# Patient Record
Sex: Male | Born: 1975 | Race: Black or African American | Hispanic: No | Marital: Single | Smoking: Never smoker
Health system: Southern US, Community
[De-identification: ages and names within clinical notes are randomized; demographics above are authoritative.]

## PROBLEM LIST (undated history)

## (undated) DIAGNOSIS — F419 Anxiety disorder, unspecified: Secondary | ICD-10-CM

## (undated) DIAGNOSIS — I2510 Atherosclerotic heart disease of native coronary artery without angina pectoris: Secondary | ICD-10-CM

## (undated) DIAGNOSIS — E785 Hyperlipidemia, unspecified: Secondary | ICD-10-CM

## (undated) HISTORY — DX: Atherosclerotic heart disease of native coronary artery without angina pectoris: I25.10

## (undated) HISTORY — PX: WISDOM TOOTH EXTRACTION: SHX21

## (undated) HISTORY — DX: Anxiety disorder, unspecified: F41.9

## (undated) HISTORY — DX: Hyperlipidemia, unspecified: E78.5

---

## 2003-03-18 ENCOUNTER — Emergency Department (HOSPITAL_COMMUNITY): Admission: EM | Admit: 2003-03-18 | Discharge: 2003-03-18 | Payer: Self-pay | Admitting: Emergency Medicine

## 2004-05-07 ENCOUNTER — Emergency Department (HOSPITAL_COMMUNITY): Admission: EM | Admit: 2004-05-07 | Discharge: 2004-05-08 | Payer: Self-pay | Admitting: Emergency Medicine

## 2018-07-18 ENCOUNTER — Emergency Department (HOSPITAL_COMMUNITY)
Admission: EM | Admit: 2018-07-18 | Discharge: 2018-07-19 | Disposition: A | Discharge status: Home / Self Care | Payer: 59 | Attending: Emergency Medicine | Admitting: Emergency Medicine

## 2018-07-18 ENCOUNTER — Other Ambulatory Visit: Payer: Self-pay

## 2018-07-18 ENCOUNTER — Encounter (HOSPITAL_COMMUNITY): Payer: Self-pay | Admitting: Emergency Medicine

## 2018-07-18 DIAGNOSIS — R0789 Other chest pain: Secondary | ICD-10-CM | POA: Diagnosis not present

## 2018-07-18 NOTE — ED Triage Notes (Signed)
Pt reports feeling something different and funny in his chest all week but pain began earlier today. Pt reports no medical hx. Pt denies, dizziness, lightheadedness. Pt also denies SHOB, chest tightness, and N/V.Pt reports tingling in his hands Pt reports having anxiety today.

## 2018-07-19 ENCOUNTER — Emergency Department (HOSPITAL_COMMUNITY): Payer: 59

## 2018-07-19 LAB — I-STAT TROPONIN, ED
Troponin i, poc: 0 ng/mL (ref 0.00–0.08)
Troponin i, poc: 0 ng/mL (ref 0.00–0.08)

## 2018-07-19 LAB — CBC
HCT: 41.5 % (ref 39.0–52.0)
Hemoglobin: 13.3 g/dL (ref 13.0–17.0)
MCH: 30.6 pg (ref 26.0–34.0)
MCHC: 32 g/dL (ref 30.0–36.0)
MCV: 95.4 fL (ref 80.0–100.0)
Platelets: 245 10*3/uL (ref 150–400)
RBC: 4.35 MIL/uL (ref 4.22–5.81)
RDW: 11.7 % (ref 11.5–15.5)
WBC: 7 10*3/uL (ref 4.0–10.5)
nRBC: 0 % (ref 0.0–0.2)

## 2018-07-19 LAB — BASIC METABOLIC PANEL
Anion gap: 9 (ref 5–15)
BUN: 12 mg/dL (ref 6–20)
CO2: 23 mmol/L (ref 22–32)
Calcium: 9.2 mg/dL (ref 8.9–10.3)
Chloride: 103 mmol/L (ref 98–111)
Creatinine, Ser: 1.12 mg/dL (ref 0.61–1.24)
GFR calc Af Amer: >60 mL/min (ref >60)
GFR calc non Af Amer: >60 mL/min (ref >60)
GLUCOSE: 107 mg/dL — AB (ref 70–99)
Potassium: 3.6 mmol/L (ref 3.5–5.1)
Sodium: 135 mmol/L (ref 135–145)

## 2018-07-19 MED ORDER — LORAZEPAM 1 MG PO TABS
1.0000 mg | ORAL_TABLET | Freq: Once | ORAL | Status: AC
  Administered 2018-07-19: 1 mg via ORAL
  Filled 2018-07-19: qty 1

## 2018-07-19 MED ORDER — HYDROXYZINE HCL 25 MG PO TABS: 25.0000 mg | ORAL_TABLET | Freq: Three times a day (TID) | ORAL | 0 refills | Status: AC | PRN

## 2018-07-19 MED ORDER — ASPIRIN 81 MG PO CHEW
324.0000 mg | CHEWABLE_TABLET | Freq: Once | ORAL | Status: AC
  Administered 2018-07-19: 324 mg via ORAL
  Filled 2018-07-19: qty 4

## 2018-07-19 NOTE — ED Provider Notes (Signed)
[facility] EMERGENCY DEPARTMENT Provider Note   CSN: [id number] Arrival date & time: 07/18/18  2330    History   Chief Complaint Chief Complaint  Patient presents with  . Chest Pain    HPI Timothy Weber is a 43 y.o. male.  HPI: A 43 year old patient presents for evaluation of chest pain. Initial onset of pain was approximately 3-6 hours ago. The patient's chest pain is described as heaviness/pressure/tightness and is not worse with exertion. The patient's chest pain is middle- or left-sided, is not well-localized, is not sharp and does not radiate to the arms/jaw/neck. The patient does not complain of nausea and denies diaphoresis. The patient has no history of stroke, has no history of peripheral artery disease, has not smoked in the past 90 days, denies any history of treated diabetes, has no relevant family history of coronary artery disease (first degree relative at less than age 44), is not hypertensive, has no history of hypercholesterolemia and does not have an elevated BMI (>=30).    Chest Pain  Associated symptoms: numbness (bilateral hands, with anxiety)   Associated symptoms: no abdominal pain, no cough, no fatigue, no fever, no headache, no nausea, no shortness of breath, no vomiting and no weakness     History reviewed. No pertinent past medical history.  There are no active problems to display for this patient.   History reviewed. No pertinent surgical history.      Home Medications    Prior to Admission medications   Medication Sig Start Date End Date Taking? Authorizing Provider  hydrOXYzine (ATARAX/VISTARIL) 25 MG tablet Take 1 tablet (25 mg total) by mouth every 8 (eight) hours as needed for anxiety. 07/19/18   Shaune Pollack, MD    Family History History reviewed. No pertinent family history.  Social History Social History   Tobacco Use  . Smoking status: Never Smoker  . Smokeless tobacco: Never Used  Substance Use Topics  .  Alcohol use: Never    Frequency: Never  . Drug use: Not Currently    Types: Marijuana     Allergies   Patient has no known allergies.   Review of Systems Review of Systems  Constitutional: Negative for chills, fatigue and fever.  HENT: Negative for congestion and rhinorrhea.   Eyes: Negative for visual disturbance.  Respiratory: Negative for cough, shortness of breath and wheezing.   Cardiovascular: Positive for chest pain. Negative for leg swelling.  Gastrointestinal: Negative for abdominal pain, diarrhea, nausea and vomiting.  Genitourinary: Negative for dysuria and flank pain.  Musculoskeletal: Negative for neck pain and neck stiffness.  Skin: Negative for rash and wound.  Allergic/Immunologic: Negative for immunocompromised state.  Neurological: Positive for numbness (bilateral hands, with anxiety). Negative for syncope, weakness and headaches.  Psychiatric/Behavioral: The patient is nervous/anxious.   All other systems reviewed and are negative.    Physical Exam Updated Vital Signs BP 112/69   Pulse (!) 57   Temp 97.9 F (36.6 C) (Oral)   Resp 15   Ht 6\' 1"  (1.854 m)   Wt 70.3 kg   SpO2 97%   BMI 20.45 kg/m   Physical Exam Vitals signs and nursing note reviewed.  Constitutional:      General: He is not in acute distress.    Appearance: He is well-developed.  HENT:     Head: Normocephalic and atraumatic.  Eyes:     Conjunctiva/sclera: Conjunctivae normal.  Neck:     Musculoskeletal: Neck supple.  Cardiovascular:  Rate and Rhythm: Normal rate and regular rhythm.     Heart sounds: Normal heart sounds. No murmur. No friction rub.  Pulmonary:     Effort: Pulmonary effort is normal. No respiratory distress.     Breath sounds: Normal breath sounds. No wheezing or rales.  Abdominal:     General: There is no distension.     Palpations: Abdomen is soft.     Tenderness: There is no abdominal tenderness.  Musculoskeletal:     Right lower leg: No edema.      Left lower leg: No edema.  Skin:    General: Skin is warm.     Capillary Refill: Capillary refill takes less than 2 seconds.  Neurological:     Mental Status: He is alert and oriented to person, place, and time.     Motor: No abnormal muscle tone.      ED Treatments / Results  Labs (all labs ordered are listed, but only abnormal results are displayed) Labs Reviewed  BASIC METABOLIC PANEL - Abnormal; Notable for the following components:      Result Value   Glucose, Bld 107 (*)    All other components within normal limits  CBC  I-STAT TROPONIN, ED  I-STAT TROPONIN, ED    EKG EKG Interpretation  Date/Time:  Wednesday July 18 2018 23:44:16 EDT Ventricular Rate:  75 PR Interval:    QRS Duration: 95 QT Interval:  404 QTC Calculation: 452 R Axis:   85 Text Interpretation:  Sinus rhythm No significant change since last tracing Confirmed by Shaune Pollack [phone]) on 07/18/2018 11:56:23 PM   Radiology Dg Chest 2 View  Result Date: 07/19/2018 CLINICAL DATA:  Left side chest pain EXAM: CHEST - 2 VIEW COMPARISON:  None. FINDINGS: Hyperinflation. Heart and mediastinal contours are within normal limits. Biapical scarring. No focal opacities or effusions. No acute bony abnormality. IMPRESSION: Hyperinflation.  No active cardiopulmonary disease. Electronically Signed   By: Charlett Nose M.D.   On: 07/19/2018 00:34    Procedures Procedures (including critical care time)  Medications Ordered in ED Medications  aspirin chewable tablet 324 mg (324 mg Oral Given 07/19/18 0047)  LORazepam (ATIVAN) tablet 1 mg (1 mg Oral Given 07/19/18 0240)     Initial Impression / Assessment and Plan / ED Course  I have reviewed the triage vital signs and the nursing notes.  Pertinent labs & imaging results that were available during my care of the patient were reviewed by me and considered in my medical decision making (see chart for details).  Clinical Course as of Jul 19 743  Thu Jul 19, 2018   0020 43 yo M here with atypical CP. Suspect possible anxiety, DDx includes gastritis, transient likely benign arrhythmia. Low concern for ACS and HEART score is <3. Pain is not c/w PE and he is PERC neg. Pain not c/w dissection. Will check labs, CXR, re-assess. Reassurance provided.   [CI]    Clinical Course User Index [CI] Shaune Pollack, MD    HEAR Score: 1  Delta trop negative. Doubt ACS. Will d/c with outpt follow-up, return precautions. No apaprent emergent pathology. VSS in ED. He is pain free here.   Final Clinical Impressions(s) / ED Diagnoses   Final diagnoses:  Atypical chest pain    ED Discharge Orders         Ordered    hydrOXYzine (ATARAX/VISTARIL) 25 MG tablet  Every 8 hours PRN     07/19/18 0308  Shaune Pollack, MD 07/19/18 [phone]

## 2019-11-15 ENCOUNTER — Ambulatory Visit: Payer: No Typology Code available for payment source | Admitting: Cardiology

## 2019-11-15 ENCOUNTER — Encounter: Payer: Self-pay | Admitting: Cardiology

## 2019-11-15 ENCOUNTER — Other Ambulatory Visit: Payer: Self-pay

## 2019-11-15 VITALS — BP 129/76 | HR 71 | Ht 73.0 in | Wt 158.0 lb

## 2019-11-15 DIAGNOSIS — R079 Chest pain, unspecified: Secondary | ICD-10-CM

## 2019-11-15 DIAGNOSIS — E782 Mixed hyperlipidemia: Secondary | ICD-10-CM

## 2019-11-15 MED ORDER — METOPROLOL TARTRATE 25 MG PO TABS: 25.0000 mg | ORAL_TABLET | Freq: Two times a day (BID) | ORAL | 0 refills | Status: DC

## 2019-11-15 NOTE — Progress Notes (Signed)
Date:  11/15/2019   ID:  Timothy Weber, DOB 15-Dec-1975, MRN [id number]  PCP:  Timothy Rosser, PA-C  Cardiologist:  Timothy Kras, DO, [facility] (established care 11/15/2019)  REASON FOR CONSULT: Chest pain, atypical  REQUESTING PHYSICIAN:  Timothy Rosser, PA-C [address] [place],  [place] [address]  Chief Complaint  Patient presents with  . Chest Pain    pt c/o chest pain for 2 months  . New Patient (Initial Visit)    HPI  Timothy Weber is a 44 y.o. male who presents to the office with a chief complaint of " chest pain." He is referred to the office at the request of Long, [place], PA-C. Patient's past medical history and cardiovascular risk factors include: Mixed hyperlipidemia.  Patient is referred to the office with a request of his primary care provider for evaluation of chest pain.  Patient stated that his chest pain has been going on for approximately 2 months.  He describes it as a tightness-like sensation over his left anterior chest wall, intermittent duration and more noticeable nowadays.  The intensity is 2 out of 10 constant throughout the day, nonradiating, better with exercise, no worsening factors.  The pain is usually self-limited.  No prior history of chest trauma, no history of pneumonia or COVID-19 infection. The pain is not positional or pleuritic.  No premature CAD in family or sudden cardiac death.   Denies prior history of coronary artery disease, myocardial infarction, congestive heart failure, deep venous thrombosis, pulmonary embolism, stroke, transient ischemic attack.  FUNCTIONAL STATUS: Running in place, push up and pull pull atleast three days a week.    ALLERGIES: No Known Allergies  MEDICATION LIST PRIOR TO VISIT: Current Meds  Medication Sig  . hydrOXYzine (ATARAX/VISTARIL) 25 MG tablet Take 1 tablet (25 mg total) by mouth every 8 (eight) hours as needed for anxiety.  Marland Kitchen ibuprofen (ADVIL) 200 MG tablet Take 200 mg by mouth every 6 (six) hours as  needed.  . Multiple Vitamin (MULTIVITAMIN) LIQD Take 5 mLs by mouth daily.  . rosuvastatin (CRESTOR) 10 MG tablet Take 10 mg by mouth daily.     PAST MEDICAL HISTORY: Past Medical History:  Diagnosis Date  . Anxiety   . Hyperlipidemia     PAST SURGICAL HISTORY: Past Surgical History:  Procedure Laterality Date  . WISDOM TOOTH EXTRACTION      FAMILY HISTORY: The patient family history includes Hypertension in his mother; Lung cancer in his father; Thyroid disease in his sister.  SOCIAL HISTORY:  The patient  reports that he has never smoked. He has never used smokeless tobacco. He reports current alcohol use. He reports previous drug use. Drug: Marijuana.  REVIEW OF SYSTEMS: Review of Systems  Constitutional: Negative for chills and fever.  HENT: Negative for hoarse voice and nosebleeds.   Eyes: Negative for discharge, double vision and pain.  Cardiovascular: Positive for chest pain. Negative for claudication, dyspnea on exertion, leg swelling, near-syncope, orthopnea, palpitations, paroxysmal nocturnal dyspnea and syncope.  Respiratory: Negative for hemoptysis and shortness of breath.   Musculoskeletal: Negative for muscle cramps and myalgias.  Gastrointestinal: Negative for abdominal pain, constipation, diarrhea, hematemesis, hematochezia, melena, nausea and vomiting.  Neurological: Negative for dizziness and light-headedness.    PHYSICAL EXAM: Vitals with BMI 11/15/2019 07/19/2018 07/19/2018  Height '6\' 1"'  - -  Weight 158 lbs - -  BMI 93.73 - -  Systolic 428 768 115  Diastolic 76 69 69  Pulse 71 57 54    CONSTITUTIONAL: Well-developed  and well-nourished. No acute distress.  SKIN: Skin is warm and dry. No rash noted. No cyanosis. No pallor. No jaundice HEAD: Normocephalic and atraumatic.  EYES: No scleral icterus MOUTH/THROAT: Moist oral membranes.  NECK: No JVD present. No thyromegaly noted. No carotid bruits  LYMPHATIC: No visible cervical adenopathy.  CHEST Normal  respiratory effort. No intercostal retractions  LUNGS: Clear to auscultation bilaterally. No stridor. No wheezes. No rales.  CARDIOVASCULAR: Regular rate and rhythm, positive S1-S2, no murmurs rubs or gallops appreciated. ABDOMINAL: No apparent ascites.  EXTREMITIES: No peripheral edema  HEMATOLOGIC: No significant bruising NEUROLOGIC: Oriented to person, place, and time. Nonfocal. Normal muscle tone.  PSYCHIATRIC: Normal mood and affect. Normal behavior. Cooperative  CARDIAC DATABASE: EKG: 11/15/2019: Normal sinus rhythm, 62 bpm, normal axis poor R wave progression without underlying ischemia or injury.  Echocardiogram: None  Stress Testing: None  Heart Catheterization: None  LABORATORY DATA: External Labs: Collected: 10/17/2019 Creatinine 1.24 mg/dL. eGFR: 82 mL/min per 1.73 m Lipid profile: Total cholesterol 230, triglycerides 62, HDL 59, LDL 156, non-HDL 171 TSH: 1.27   IMPRESSION:    ICD-10-CM   1. Chest pain, unspecified type  R07.9 EKG 12-Lead    PCV ECHOCARDIOGRAM COMPLETE    CT CORONARY MORPH W/CTA COR W/SCORE W/CA W/CM &/OR WO/CM    CT CORONARY FRACTIONAL FLOW RESERVE DATA PREP    CT CORONARY FRACTIONAL FLOW RESERVE FLUID ANALYSIS    Basic metabolic panel    metoprolol tartrate (LOPRESSOR) 25 MG tablet  2. Mixed hyperlipidemia  E78.2      RECOMMENDATIONS: Timothy Weber is a 44 y.o. male whose past medical history and cardiac risk factors include: Mixed hyperlipidemia.  Precordial chest pain:  EKG showed normal sinus rhythm without underlying ischemia or injury pattern.  Echocardiogram will be ordered to evaluate for structural heart disease and left ventricular systolic function.  Shared decision was to proceed with coronary CTA w/ FFR.  Check BMP prior to coronary CTA.  Metoprolol 25 mg p.o. twice daily starting 2 days prior to the CT scan.  Mixed hyperlipidemia:   Patient was recently started on Crestor 10 mg p.o. nightly by his primary care  provider after the labs from June 2021.    Currently managed by PCP.    Does not endorse myalgias.  FINAL MEDICATION LIST END OF ENCOUNTER: Meds ordered this encounter  Medications  . metoprolol tartrate (LOPRESSOR) 25 MG tablet    Sig: Take 1 tablet (25 mg total) by mouth 2 (two) times daily for 4 days.    Dispense:  8 tablet    Refill:  0    There are no discontinued medications.   Current Outpatient Medications:  .  hydrOXYzine (ATARAX/VISTARIL) 25 MG tablet, Take 1 tablet (25 mg total) by mouth every 8 (eight) hours as needed for anxiety., Disp: 12 tablet, Rfl: 0 .  ibuprofen (ADVIL) 200 MG tablet, Take 200 mg by mouth every 6 (six) hours as needed., Disp: , Rfl:  .  Multiple Vitamin (MULTIVITAMIN) LIQD, Take 5 mLs by mouth daily., Disp: , Rfl:  .  rosuvastatin (CRESTOR) 10 MG tablet, Take 10 mg by mouth daily., Disp: , Rfl:  .  metoprolol tartrate (LOPRESSOR) 25 MG tablet, Take 1 tablet (25 mg total) by mouth 2 (two) times daily for 4 days., Disp: 8 tablet, Rfl: 0  Orders Placed This Encounter  Procedures  . CT CORONARY MORPH W/CTA COR W/SCORE W/CA W/CM &/OR WO/CM  . CT CORONARY FRACTIONAL FLOW RESERVE DATA PREP  . CT  CORONARY FRACTIONAL FLOW RESERVE FLUID ANALYSIS  . Basic metabolic panel  . EKG 12-Lead  . PCV ECHOCARDIOGRAM COMPLETE    Patient Instructions  Increase fluid intake to 8 glass of water.  Decrease motrin use, talk to PCP.  Blood work couple days before CT Scan.  Start Lopressor two days before the CT Scan.    --Continue cardiac medications as reconciled in final medication list. --Return in about 4 weeks (around 12/13/2019) for review test results CCTA and Echo. . Or sooner if needed. --Continue follow-up with your primary care physician regarding the management of your other chronic comorbid conditions.  Patient's questions and concerns were addressed to his satisfaction. He voices understanding of the instructions provided during this encounter.   This  note was created using a voice recognition software as a result there may be grammatical errors inadvertently enclosed that do not reflect the nature of this encounter. Every attempt is made to correct such errors.  Timothy Weber, [place], [facility]  Pager: [phone] Office: [phone]

## 2019-11-15 NOTE — Patient Instructions (Addendum)
Increase fluid intake to 8 glass of water.  Decrease motrin use, talk to PCP.  Blood work couple days before CT Scan.  Start Lopressor two days before the CT Scan.

## 2019-11-16 ENCOUNTER — Other Ambulatory Visit: Payer: Self-pay | Admitting: Cardiology

## 2019-11-16 DIAGNOSIS — R079 Chest pain, unspecified: Secondary | ICD-10-CM

## 2019-11-17 ENCOUNTER — Other Ambulatory Visit: Payer: Self-pay | Admitting: Cardiology

## 2019-11-17 DIAGNOSIS — R079 Chest pain, unspecified: Secondary | ICD-10-CM

## 2019-11-18 ENCOUNTER — Other Ambulatory Visit: Payer: Self-pay | Admitting: Cardiology

## 2019-11-18 DIAGNOSIS — R079 Chest pain, unspecified: Secondary | ICD-10-CM

## 2019-11-19 ENCOUNTER — Other Ambulatory Visit: Payer: Self-pay | Admitting: Cardiology

## 2019-11-19 DIAGNOSIS — R079 Chest pain, unspecified: Secondary | ICD-10-CM

## 2019-11-21 ENCOUNTER — Other Ambulatory Visit: Payer: Self-pay

## 2019-11-21 DIAGNOSIS — R079 Chest pain, unspecified: Secondary | ICD-10-CM

## 2019-11-21 MED ORDER — METOPROLOL TARTRATE 25 MG PO TABS: ORAL_TABLET | ORAL | 0 refills | Status: DC

## 2019-11-22 ENCOUNTER — Other Ambulatory Visit: Payer: Self-pay

## 2019-11-22 ENCOUNTER — Ambulatory Visit: Payer: No Typology Code available for payment source

## 2019-11-22 DIAGNOSIS — R079 Chest pain, unspecified: Secondary | ICD-10-CM

## 2019-11-28 ENCOUNTER — Telehealth: Payer: Self-pay

## 2019-11-28 NOTE — Telephone Encounter (Signed)
-----   Message from [place], [place] sent at 11/24/2019  4:28 PM EDT ----- The left ventricular ejection fraction or pumping activity of the heart is within the normal limit.  Other details will be reviewed at the next office visit.

## 2019-11-28 NOTE — Telephone Encounter (Signed)
LMTCB with results.

## 2019-12-02 ENCOUNTER — Other Ambulatory Visit: Payer: Self-pay | Admitting: Cardiology

## 2019-12-03 LAB — BASIC METABOLIC PANEL
BUN/Creatinine Ratio: 9 (ref 9–20)
BUN: 11 mg/dL (ref 6–24)
CO2: 25 mmol/L (ref 20–29)
Calcium: 9.2 mg/dL (ref 8.7–10.2)
Chloride: 101 mmol/L (ref 96–106)
Creatinine, Ser: 1.19 mg/dL (ref 0.76–1.27)
GFR calc Af Amer: 86 mL/min/{1.73_m2} (ref >59)
GFR calc non Af Amer: 74 mL/min/{1.73_m2} (ref >59)
Glucose: 160 mg/dL — ABNORMAL HIGH (ref 65–99)
Potassium: 4.2 mmol/L (ref 3.5–5.2)
Sodium: 139 mmol/L (ref 134–144)

## 2019-12-04 NOTE — Progress Notes (Signed)
Called patient, NA, LMAM to call back for Lab results.

## 2019-12-04 NOTE — Progress Notes (Signed)
Patient called back, I have discussed lab results with patient.

## 2019-12-09 ENCOUNTER — Telehealth (HOSPITAL_COMMUNITY): Payer: Self-pay | Admitting: *Deleted

## 2019-12-09 NOTE — Telephone Encounter (Signed)
Reaching out to patient to offer assistance regarding upcoming cardiac imaging study; pt verbalizes understanding of appt date/time, parking situation and where to check in, pre-test NPO status and medications ordered, and verified current allergies; name and call back number provided for further questions should they arise ° °Damondre Pfeifle Tai RN Navigator Cardiac Imaging °[facility] °[phone] office °[phone] cell ° °

## 2019-12-11 ENCOUNTER — Ambulatory Visit (HOSPITAL_COMMUNITY)
Admission: RE | Admit: 2019-12-11 | Discharge: 2019-12-11 | Disposition: A | Discharge status: Home / Self Care | Payer: No Typology Code available for payment source | Source: Ambulatory Visit | Attending: Cardiology | Admitting: Cardiology

## 2019-12-11 ENCOUNTER — Other Ambulatory Visit: Payer: Self-pay

## 2019-12-11 ENCOUNTER — Encounter: Payer: No Typology Code available for payment source | Admitting: *Deleted

## 2019-12-11 DIAGNOSIS — R079 Chest pain, unspecified: Secondary | ICD-10-CM | POA: Insufficient documentation

## 2019-12-11 DIAGNOSIS — Z006 Encounter for examination for normal comparison and control in clinical research program: Secondary | ICD-10-CM

## 2019-12-11 MED ORDER — NITROGLYCERIN 0.4 MG SL SUBL
0.8000 mg | SUBLINGUAL_TABLET | Freq: Once | SUBLINGUAL | Status: AC
  Administered 2019-12-11: 0.8 mg via SUBLINGUAL

## 2019-12-11 MED ORDER — NITROGLYCERIN 0.4 MG SL SUBL
SUBLINGUAL_TABLET | SUBLINGUAL | Status: AC
  Filled 2019-12-11: qty 2

## 2019-12-11 MED ORDER — IOHEXOL 350 MG/ML SOLN
80.0000 mL | Freq: Once | INTRAVENOUS | Status: AC | PRN
  Administered 2019-12-11: 80 mL via INTRAVENOUS

## 2019-12-11 NOTE — Research (Signed)
CADFEM Informed Consent                  Subject Name:   Timothy Weber   Subject met inclusion and exclusion criteria.  The informed consent form, study requirements and expectations were reviewed with the subject and questions and concerns were addressed prior to the signing of the consent form.  The subject verbalized understanding of the trial requirements.  The subject agreed to participate in the CADFEM trial and signed the informed consent.  The informed consent was obtained prior to performance of any protocol-specific procedures for the subject.  A copy of the signed informed consent was given to the subject and a copy was placed in the subject's medical record.   [place] Charese Abundis, Research Assistant  12/11/2019 12:11 p.m.

## 2019-12-13 ENCOUNTER — Other Ambulatory Visit: Payer: Self-pay | Admitting: Cardiology

## 2019-12-13 ENCOUNTER — Ambulatory Visit: Payer: No Typology Code available for payment source | Admitting: Cardiology

## 2019-12-13 ENCOUNTER — Ambulatory Visit (HOSPITAL_COMMUNITY)
Admission: RE | Admit: 2019-12-13 | Discharge: 2019-12-13 | Disposition: A | Discharge status: Home / Self Care | Payer: No Typology Code available for payment source | Source: Ambulatory Visit | Attending: Cardiology | Admitting: Cardiology

## 2019-12-13 DIAGNOSIS — R079 Chest pain, unspecified: Secondary | ICD-10-CM | POA: Diagnosis not present

## 2019-12-16 ENCOUNTER — Encounter: Payer: Self-pay | Admitting: Cardiology

## 2019-12-16 ENCOUNTER — Other Ambulatory Visit: Payer: Self-pay

## 2019-12-16 ENCOUNTER — Ambulatory Visit: Payer: No Typology Code available for payment source | Admitting: Cardiology

## 2019-12-16 VITALS — BP 130/65 | HR 73 | Resp 17 | Ht 73.0 in | Wt 158.0 lb

## 2019-12-16 DIAGNOSIS — E782 Mixed hyperlipidemia: Secondary | ICD-10-CM

## 2019-12-16 DIAGNOSIS — Z712 Person consulting for explanation of examination or test findings: Secondary | ICD-10-CM

## 2019-12-16 DIAGNOSIS — R072 Precordial pain: Secondary | ICD-10-CM

## 2019-12-16 DIAGNOSIS — I2510 Atherosclerotic heart disease of native coronary artery without angina pectoris: Secondary | ICD-10-CM

## 2019-12-16 MED ORDER — ASPIRIN EC 81 MG PO TBEC: 81.0000 mg | DELAYED_RELEASE_TABLET | Freq: Every day | ORAL | 11 refills | Status: AC

## 2019-12-16 MED ORDER — METOPROLOL SUCCINATE ER 25 MG PO TB24: 25.0000 mg | ORAL_TABLET | Freq: Every day | ORAL | 0 refills | Status: AC

## 2019-12-16 NOTE — Progress Notes (Signed)
Date:  12/22/2019   ID:  [name], DOB July 14, 1975, MRN [id number]  PCP:  Shanon Rosser, PA-C  Cardiologist:  Rex Kras, DO, [facility] (established care 11/15/2019)  Date: 12/16/2019 Last Office Visit: 11/15/2019  Chief Complaint  Patient presents with  . Chest Pain  . Follow-up    4 week  . Results    echo    HPI  Timothy Weber is a 44 y.o. male who presents to the office with a chief complaint of " reevaluation of chest pain to review test results." Patient's past medical history and cardiovascular risk factors include: Nonobstructive CAD, mixed hyperlipidemia.  Patient is referred to the office with a request of his primary care provider for evaluation of chest pain.  At last office visit patient was having symptoms of chest discomfort which appeared to be atypical in nature.  However, due to cardiovascular risk factors this shared decision was to proceed with coronary CTA and echocardiogram.  Since last office visit patient states that he continues to have chest discomfort over his left anterior chest wall, he describes it as a tightness-like sensation, notes present throughout the day.  The pain is not pleuritic in nature and nonpositional.  It is not worse with activity nor is improved with rest and is usually self-limited.  Patient is informed that his echocardiogram notes preserved LVEF with normal diastolic function.  Coronary CTA notes a total coronary artery calcification score 0 but he does have noncalcified plaque noted in the coronary tree.  Findings discussed with the patient along with the images of the coronary CT FFR.   No premature CAD in family or sudden cardiac death.   Denies prior history of coronary artery disease, myocardial infarction, congestive heart failure, deep venous thrombosis, pulmonary embolism, stroke, transient ischemic attack.  FUNCTIONAL STATUS: Running in place, push up and pull up atleast three days a week.    ALLERGIES: No Known  Allergies  MEDICATION LIST PRIOR TO VISIT: Current Meds  Medication Sig  . hydrOXYzine (ATARAX/VISTARIL) 25 MG tablet Take 1 tablet (25 mg total) by mouth every 8 (eight) hours as needed for anxiety.  Marland Kitchen ibuprofen (ADVIL) 200 MG tablet Take 200 mg by mouth every 6 (six) hours as needed.  . Multiple Vitamin (MULTIVITAMIN) LIQD Take 5 mLs by mouth daily.  . rosuvastatin (CRESTOR) 10 MG tablet Take 10 mg by mouth daily.     PAST MEDICAL HISTORY: Past Medical History:  Diagnosis Date  . Anxiety   . Coronary artery disease   . Hyperlipidemia     PAST SURGICAL HISTORY: Past Surgical History:  Procedure Laterality Date  . WISDOM TOOTH EXTRACTION      FAMILY HISTORY: The patient family history includes Hypertension in his mother; Lung cancer in his father; Thyroid disease in his sister.  SOCIAL HISTORY:  The patient  reports that he has never smoked. He has never used smokeless tobacco. He reports current alcohol use. He reports previous drug use. Drug: Marijuana.  REVIEW OF SYSTEMS: Review of Systems  Constitutional: Negative for chills and fever.  HENT: Negative for hoarse voice and nosebleeds.   Eyes: Negative for discharge, double vision and pain.  Cardiovascular: Positive for chest pain. Negative for claudication, dyspnea on exertion, leg swelling, near-syncope, orthopnea, palpitations, paroxysmal nocturnal dyspnea and syncope.  Respiratory: Negative for hemoptysis and shortness of breath.   Musculoskeletal: Negative for muscle cramps and myalgias.  Gastrointestinal: Negative for abdominal pain, constipation, diarrhea, hematemesis, hematochezia, melena, nausea and vomiting.  Neurological: Negative for  dizziness and light-headedness.    PHYSICAL EXAM: Vitals with BMI 12/16/2019 12/11/2019 12/11/2019  Height '6\' 1"'  - -  Weight 158 lbs - -  BMI 21.30 - -  Systolic 865 784 96  Diastolic 65 71 72  Pulse 73 - -    CONSTITUTIONAL: Well-developed and well-nourished. No acute  distress.  SKIN: Skin is warm and dry. No rash noted. No cyanosis. No pallor. No jaundice HEAD: Normocephalic and atraumatic.  EYES: No scleral icterus MOUTH/THROAT: Moist oral membranes.  NECK: No JVD present. No thyromegaly noted. No carotid bruits  LYMPHATIC: No visible cervical adenopathy.  CHEST Normal respiratory effort. No intercostal retractions  LUNGS: Clear to auscultation bilaterally. No stridor. No wheezes. No rales.  CARDIOVASCULAR: Regular rate and rhythm, positive S1-S2, no murmurs rubs or gallops appreciated. ABDOMINAL: No apparent ascites.  EXTREMITIES: No peripheral edema  HEMATOLOGIC: No significant bruising NEUROLOGIC: Oriented to person, place, and time. Nonfocal. Normal muscle tone.  PSYCHIATRIC: Normal mood and affect. Normal behavior. Cooperative  CARDIAC DATABASE: EKG: 11/15/2019: Normal sinus rhythm, 62 bpm, normal axis poor R wave progression without underlying ischemia or injury.  Echocardiogram: 11/22/2019:  Left ventricle cavity is normal in size and wall thickness. Normal global wall motion. Normal LV systolic function with EF 55%. Normal diastolic filling pattern. Calculated EF 55%.  Mild prolapse of the mitral valve leaflets. Trace mitral regurgitation.  Mild tricuspid regurgitation. Estimated pulmonary artery systolic pressure 28 mmHg.   Stress Testing: None  Heart Catheterization: None  Coronary CTA 12/11/2019: 1. Coronary calcium score of 0. 2. Normal coronary origin with right dominance. 3. CADRADS = 3. Minimal noncalcified plaque in the left main. Mild stenosis in the mid to distal LAD. Mild to moderate stenosis in the LCX distribution. 4.  Mid ascending aorta measures 28 mm. 5. Study is sent for CT-FFR findings will be performed and reported separately. 6. Hyperdense lesions in the liver are difficult to characterize due to size. In the absence of known malignancy, lesions may represent flash fill hemangiomas. If further evaluation is  desired, MR abdomen without and with contrast could be performed. 7. Emphysema  RECOMMENDATIONS: CAD-RADS 3: Moderate stenosis. Consider symptom-guided anti-ischemic pharmacotherapy as well as risk factor modification per guideline directed care. No significant disease stenosis due to CT FFR.  CTFFR 12/13/2019: CT FFR analysis showed no significant stenosis.  LABORATORY DATA: External Labs: Collected: 10/17/2019 Creatinine 1.24 mg/dL. eGFR: 82 mL/min per 1.73 m Lipid profile: Total cholesterol 230, triglycerides 62, HDL 59, LDL 156, non-HDL 171 TSH: 1.27   IMPRESSION:    ICD-10-CM   1. Nonobstructive atherosclerosis of coronary artery  I25.10 aspirin EC 81 MG tablet    metoprolol succinate (TOPROL XL) 25 MG 24 hr tablet  2. Mixed hyperlipidemia  E78.2   3. Precordial chest pain  R07.2   4. Encounter to discuss test results  Z71.2      RECOMMENDATIONS: Timothy Weber is a 44 y.o. male whose past medical history and cardiac risk factors include: Nonobstructive CAD, mixed hyperlipidemia.  Non-obstructive coronary artery disease:   CCTA notes non-obstructive CAD as noted above.   Educated on risk factor modifications.   Continue statin   Start Aspirin 80m po qday.   Start Toprol 265mpo qday. Medication profile discussed with the patient.   Reviewed results of echo and CCTA with patient at today's office visit.   Precordial chest pain:  Reviewed the results of the echocardiogram and coronary CTA extensively with the patient at today's office visit.   Patient  symptoms of chest discomfort appear to be atypical in nature.  Recommend non-cardiac causes of chest discomfort with patient's PCP.  Mixed hyperlipidemia:   Currently on Crestor 10 mg p.o. nightly.  Currently managed by PCP.    Does not endorse myalgias.  Patient's coronary CTA report also noted noncardiac findings of "Hyperdense lesions in the liver are difficult to characterize due to size. In the absence  of known malignancy, lesions may represent flash fill hemangiomas. If further evaluation is desired, MR abdomen without and with contrast could be performed." Patient is asked to follow up with PCP for additional workup if clinically indicated.  Patient verbalizes understanding and provides verbal feedback.  FINAL MEDICATION LIST END OF ENCOUNTER: Meds ordered this encounter  Medications  . aspirin EC 81 MG tablet    Sig: Take 1 tablet (81 mg total) by mouth daily. Swallow whole.    Dispense:  30 tablet    Refill:  11  . metoprolol succinate (TOPROL XL) 25 MG 24 hr tablet    Sig: Take 1 tablet (25 mg total) by mouth daily.    Dispense:  30 tablet    Refill:  0     Current Outpatient Medications:  .  hydrOXYzine (ATARAX/VISTARIL) 25 MG tablet, Take 1 tablet (25 mg total) by mouth every 8 (eight) hours as needed for anxiety., Disp: 12 tablet, Rfl: 0 .  ibuprofen (ADVIL) 200 MG tablet, Take 200 mg by mouth every 6 (six) hours as needed., Disp: , Rfl:  .  Multiple Vitamin (MULTIVITAMIN) LIQD, Take 5 mLs by mouth daily., Disp: , Rfl:  .  rosuvastatin (CRESTOR) 10 MG tablet, Take 10 mg by mouth daily., Disp: , Rfl:  .  aspirin EC 81 MG tablet, Take 1 tablet (81 mg total) by mouth daily. Swallow whole., Disp: 30 tablet, Rfl: 11 .  metoprolol succinate (TOPROL XL) 25 MG 24 hr tablet, Take 1 tablet (25 mg total) by mouth daily., Disp: 30 tablet, Rfl: 0  No orders of the defined types were placed in this encounter.  There are no Patient Instructions on file for this visit.  --Continue cardiac medications as reconciled in final medication list. --Return in about 3 months (around 03/17/2020) for re-evaluation of symptoms.. Or sooner if needed. --Continue follow-up with your primary care physician regarding the management of your other chronic comorbid conditions.  Patient's questions and concerns were addressed to his satisfaction. He voices understanding of the instructions provided during this  encounter.   This note was created using a voice recognition software as a result there may be grammatical errors inadvertently enclosed that do not reflect the nature of this encounter. Every attempt is made to correct such errors.  Rex Kras, [place], [facility]  Pager: [phone] Office: [phone]

## 2019-12-17 NOTE — Progress Notes (Signed)
Left voicemail for patient to call back. 

## 2019-12-17 NOTE — Progress Notes (Signed)
Spoke with pt, pt received results at appt on 12/17/19.

## 2020-03-16 ENCOUNTER — Ambulatory Visit: Payer: No Typology Code available for payment source | Admitting: Cardiology

## 2021-02-23 IMAGING — CT CT HEART MORP W/ CTA COR W/ SCORE W/ CA W/CM &/OR W/O CM
4 of 7 series · 8 of 20 positions shown, 9 images · IV contrast (APPLIED)
Comparison: None.
COMPARISON: None.

Addendum:
EXAM:
OVER-READ INTERPRETATION  CT CHEST

The following report is an over-read performed by radiologist Dr.
Iasc Malo [REDACTED] on 12/11/2019. This
over-read does not include interpretation of cardiac or coronary
anatomy or pathology. The coronary calcium score/coronary CTA
interpretation by the cardiologist is attached.
HISTORY: 44-year-old gentleman with past medical history and cardiovascular
risk factors including hyperlipidemia presents with chest pain.
Cardiac/Coronary  CT
TECHNIQUE: The patient was scanned on a Siemens Force scanner.
PROTOCOL: A 120 kV prospective scan was triggered in the descending thoracic
aorta at 111 HU's. Axial non-contrast 3 mm slices were carried out
through the heart. The data set was analyzed on a dedicated work
station and scored using the Agatson method. Gantry rotation speed
was 250 msecs and collimation was .6 mm. No IV beta blockade and
mg of sl NTG was given. The 3D data set was reconstructed in 5%
intervals of the 67-82 % of the R-R cycle. Diastolic phases were
analyzed on a dedicated work station using MPR, MIP and VRT modes.
The patient received 80mL OMNIPAQUE IOHEXOL 350 MG/ML SOLN of
contrast.

[Series 6: best diast 78 % · axial · 0.39mm/px · z∈[+1329,+1381]mm · 2 of 392 slices shown, 3 images]
[im 131/392  vessel]
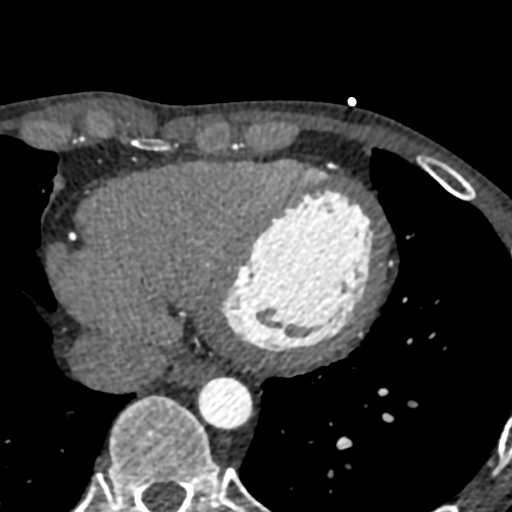
[im 131/392  lung]
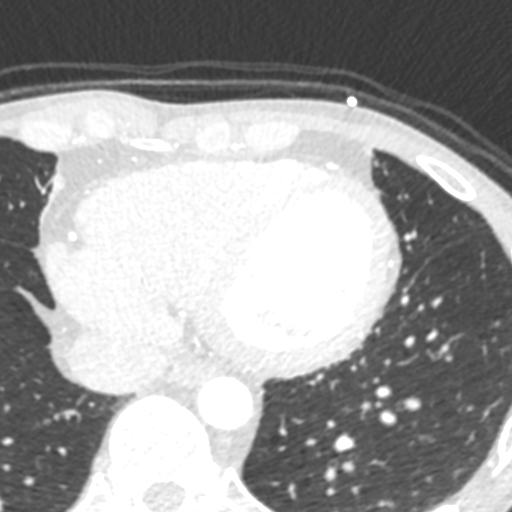
[im 261/392  vessel]
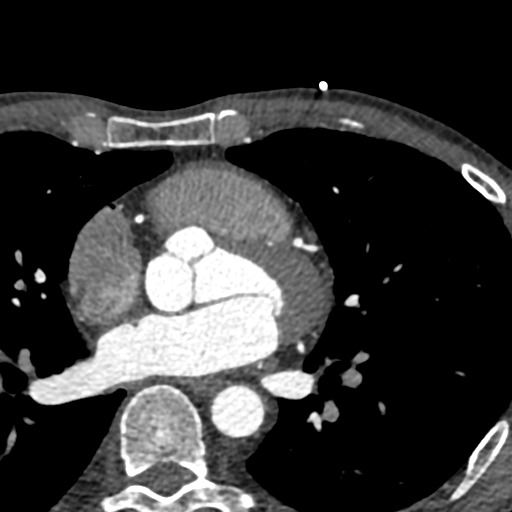

[Series 7: best syst 30 % · axial · 0.39mm/px · z∈[+1329,+1381]mm · 2 of 392 slices shown]
[im 131/392  vessel]
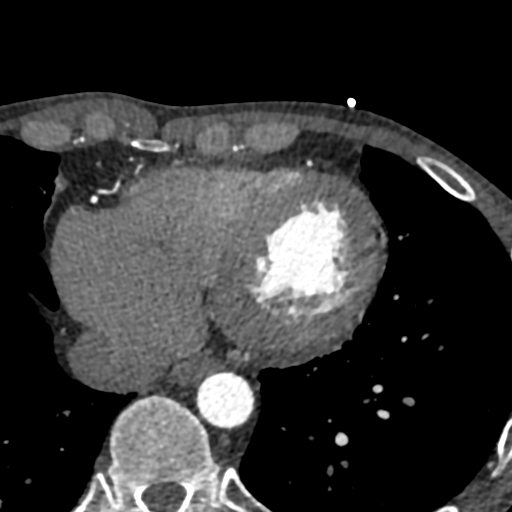
[im 261/392  vessel]
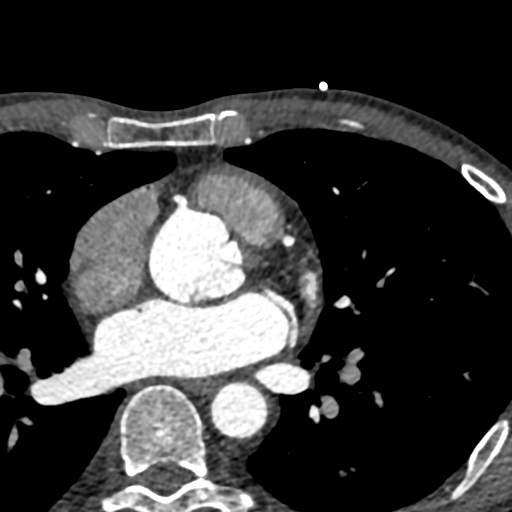

[Series 8: ts diast sharp 78 % · axial · 0.39mm/px · z∈[+1329,+1381]mm · 2 of 392 slices shown]
[im 131/392  lung]
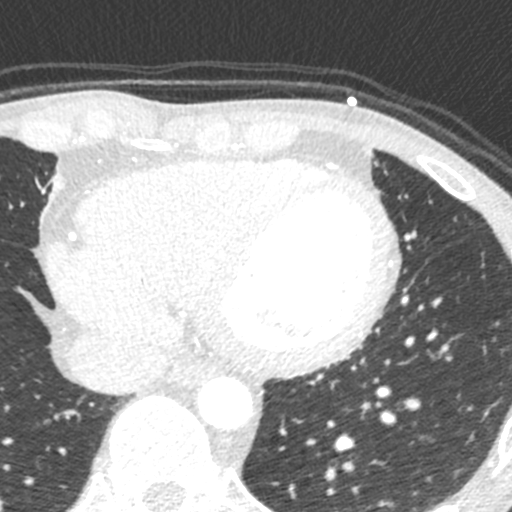
[im 261/392  lung]
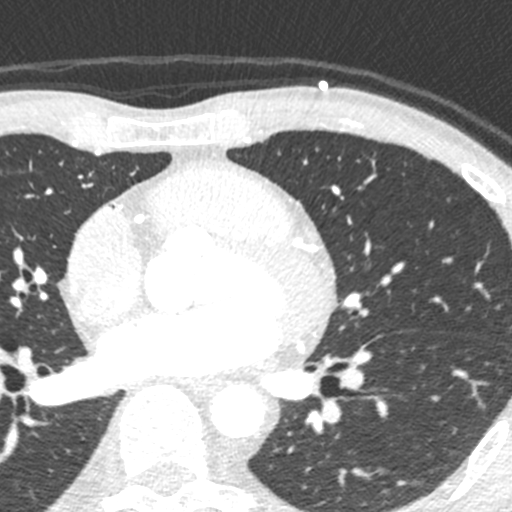

[Series 9: ts syst sharp 30 % · axial · 0.39mm/px · z∈[+1329,+1381]mm · 2 of 392 slices shown]
[im 131/392  lung]
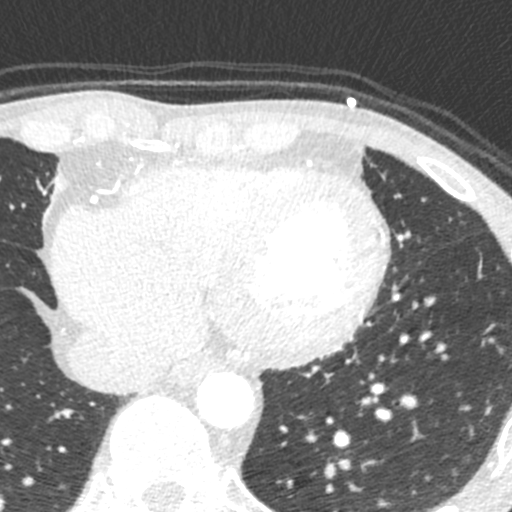
[im 261/392  lung]
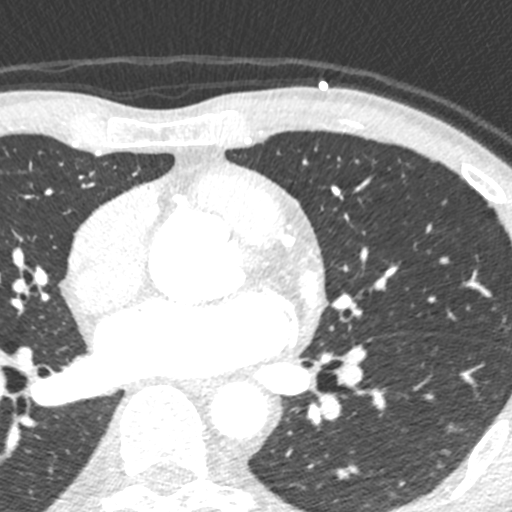

[8 of 20 positions shown; findings below may reference images not displayed]

FINDINGS: Vascular: None.

Mediastinum/Nodes: None.

Lungs/Pleura: Bullous paraseptal emphysema. Visualized lungs are
otherwise clear. No pleural fluid.

Upper Abdomen: Hyperdense lesions in the liver measure up to 1.2 cm
in the left hepatic lobe and are too small for definitive
characterization. Otherwise negative.

Musculoskeletal: None.
IMPRESSION: 1. Hyperdense lesions in the liver are difficult to characterize due
to size. In the absence of known malignancy, lesions may represent
flash fill hemangiomas. If further evaluation is desired, MR abdomen
without and with contrast could be performed.
2.  Emphysema ([id number]).
FINDINGS: Coronary calcium score is 0.

Coronary arteries: Normal coronary origins.  Right dominance.

Right Coronary Artery: Normal caliber, dominant vessel. No
significant disease.

Left Main Coronary Artery: Bifurcates into the LAD and the LCX.
Minimal noncalcified plaque (<24%) within the proximal to mid
segment.

Left Anterior Descending Coronary Artery: The LAD gives rise to 2
diagonal branches and second diagonal branch is large in caliber. In
the mid to distal LAD after the origin of second diagonal branch
mild stenosis (25-49%) due to noncalcified plaque. No significant
disease in the diagonal branches.

Left Circumflex Artery: The LCX gives rise to 2 obtuse marginal
branches. Mild stenosis (25-49 %) noted in the mid LCx segment due
to noncalcified plaque. The distal LCX segment appears to be smaller
in caliber travels within the AV groove with moderate stenosis
(50-69%) due to noncalcified plaque. Obtuse marginal 1 branch is
large in caliber with mild stenosis (25-49%) in the mid to distal
segment due to noncalcified plaque.

Aorta: Normal size, 28 mm at the mid ascending aorta (level of the
PA bifurcation) measured double oblique. No calcifications. No
dissection.

Aortic Valve: No calcifications.

Other findings:

Normal pulmonary vein drainage into the left atrium.

Normal left atrial appendage without a thrombus.

Pulmonary artery: Without proximal filling defect.

Extracardiac findings: See attached radiology report for noncardiac
structures.
IMPRESSION: 1. Coronary calcium score of 0.

2. Normal coronary origin with right dominance.

3. CADRADS = 3. Minimal noncalcified plaque in the left main. Mild
stenosis in the mid to distal LAD. Mild to moderate stenosis in the
LCX distribution.

4.  Mid ascending aorta measures 28 mm.

5. Study is sent for CT-FFR findings will be performed and reported
separately.

RECOMMENDATIONS:

CAD-RADS 3: Moderate stenosis. Consider symptom-guided anti-ischemic
pharmacotherapy as well as risk factor modification per guideline
directed care. Additional analysis with CT FFR will be submitted.

*** End of Addendum ***
EXAM:
OVER-READ INTERPRETATION  CT CHEST

The following report is an over-read performed by radiologist Dr.
Iasc Malo [REDACTED] on 12/11/2019. This
over-read does not include interpretation of cardiac or coronary
anatomy or pathology. The coronary calcium score/coronary CTA
interpretation by the cardiologist is attached.
FINDINGS: Vascular: None.

Mediastinum/Nodes: None.

Lungs/Pleura: Bullous paraseptal emphysema. Visualized lungs are
otherwise clear. No pleural fluid.

Upper Abdomen: Hyperdense lesions in the liver measure up to 1.2 cm
in the left hepatic lobe and are too small for definitive
characterization. Otherwise negative.

Musculoskeletal: None.
IMPRESSION: 1. Hyperdense lesions in the liver are difficult to characterize due
to size. In the absence of known malignancy, lesions may represent
flash fill hemangiomas. If further evaluation is desired, MR abdomen
without and with contrast could be performed.
2.  Emphysema ([id number]).
